# Patient Record
Sex: Female | Born: 1940 | Race: Black or African American | Hispanic: No | Marital: Single | Smoking: Never smoker
Health system: Southern US, Community
[De-identification: ages and names within clinical notes are randomized; demographics above are authoritative.]

## PROBLEM LIST (undated history)

## (undated) DIAGNOSIS — R42 Dizziness and giddiness: Secondary | ICD-10-CM

## (undated) DIAGNOSIS — R509 Fever, unspecified: Secondary | ICD-10-CM

## (undated) HISTORY — PX: EYE SURGERY: SHX253

---

## 2013-04-30 ENCOUNTER — Emergency Department (HOSPITAL_COMMUNITY)
Admission: EM | Admit: 2013-04-30 | Discharge: 2013-04-30 | Disposition: A | Discharge status: Home / Self Care | Payer: Self-pay | Attending: Emergency Medicine | Admitting: Emergency Medicine

## 2013-04-30 ENCOUNTER — Encounter (HOSPITAL_COMMUNITY): Payer: Self-pay | Admitting: Adult Health

## 2013-04-30 ENCOUNTER — Emergency Department (HOSPITAL_COMMUNITY): Payer: Self-pay

## 2013-04-30 DIAGNOSIS — H539 Unspecified visual disturbance: Secondary | ICD-10-CM

## 2013-04-30 DIAGNOSIS — R509 Fever, unspecified: Secondary | ICD-10-CM | POA: Insufficient documentation

## 2013-04-30 DIAGNOSIS — G8929 Other chronic pain: Secondary | ICD-10-CM | POA: Insufficient documentation

## 2013-04-30 DIAGNOSIS — R51 Headache: Secondary | ICD-10-CM | POA: Insufficient documentation

## 2013-04-30 DIAGNOSIS — R42 Dizziness and giddiness: Secondary | ICD-10-CM | POA: Insufficient documentation

## 2013-04-30 DIAGNOSIS — H538 Other visual disturbances: Secondary | ICD-10-CM | POA: Insufficient documentation

## 2013-04-30 DIAGNOSIS — H532 Diplopia: Secondary | ICD-10-CM | POA: Insufficient documentation

## 2013-04-30 HISTORY — DX: Fever, unspecified: R50.9

## 2013-04-30 HISTORY — DX: Dizziness and giddiness: R42

## 2013-04-30 LAB — COMPREHENSIVE METABOLIC PANEL
ALT: 16 U/L (ref 0–35)
AST: 23 U/L (ref 0–37)
Calcium: 9 mg/dL (ref 8.4–10.5)
GFR calc Af Amer: >90 mL/min (ref >90)
Glucose, Bld: 92 mg/dL (ref 70–99)
Sodium: 141 mEq/L (ref 135–145)
Total Protein: 8.4 g/dL — ABNORMAL HIGH (ref 6.0–8.3)

## 2013-04-30 LAB — CBC WITH DIFFERENTIAL/PLATELET
Basophils Absolute: 0 10*3/uL (ref 0.0–0.1)
Basophils Relative: 0 % (ref 0–1)
Eosinophils Absolute: 0.1 10*3/uL (ref 0.0–0.7)
Eosinophils Relative: 2 % (ref 0–5)
MCH: 29.9 pg (ref 26.0–34.0)
MCHC: 35.1 g/dL (ref 30.0–36.0)
MCV: 85.3 fL (ref 78.0–100.0)
Platelets: 247 10*3/uL (ref 150–400)
RDW: 13.2 % (ref 11.5–15.5)

## 2013-04-30 LAB — URINALYSIS, ROUTINE W REFLEX MICROSCOPIC
Bilirubin Urine: NEGATIVE
Hgb urine dipstick: NEGATIVE
Protein, ur: NEGATIVE mg/dL
Urobilinogen, UA: 1 mg/dL (ref 0.0–1.0)

## 2013-04-30 NOTE — ED Provider Notes (Signed)
Medical screening examination/treatment/procedure(s) were performed by non-physician practitioner and as supervising physician I was immediately available for consultation/collaboration.   Charles B. Bernette Mayers, MD 04/30/13 [phone]

## 2013-04-30 NOTE — ED Provider Notes (Signed)
History     CSN: [id number]  Arrival date & time 04/30/13  1614   First MD Initiated Contact with Patient 04/30/13 2010      Chief Complaint  Patient presents with  . multiple complaints     (Consider location/radiation/quality/duration/timing/severity/associated sxs/prior treatment) HPI History provided by pt and her grandson who is interpreting.  [place] from [place] 2 years ago.  She plans to move back soon, and has several problems that she would like to have checked out before she returns.  While in [place], she had intermittent fevers, max temp 106-107, and was admitted multiple times.  She is unsure of etiology.  Has been having same ever since, though less frequent now.  Has not checked her temp at home, but she feels hot all over, and it usually occurs at night.  Also c/o chronic, gradually worsening blurred vision as well as diplopia when looking at distant objects.  These symptoms worsen in sunlight.  Has also had a right temporal headache intermittently for 2 years.  No pain currently.  Headaches are not associated w/ tactile fevers.  Has room-spinning dizziness when she stands up and stretches, but resolves when she sits down.  She has not had change in speech/behavior, extremity weakness/paresthesias, ataxia.  No known PMH.   Past Medical History  Diagnosis Date  . Fever   . Dizziness     History reviewed. No pertinent past surgical history.  History reviewed. No pertinent family history.  History  Substance Use Topics  . Smoking status: Never Smoker   . Smokeless tobacco: Not on file  . Alcohol Use: No    OB History   Grav Para Term Preterm Abortions TAB SAB Ect Mult Living                  Review of Systems  All other systems reviewed and are negative.    Allergies  Review of patient's allergies indicates no known allergies.  Home Medications  No current outpatient prescriptions on file.  BP 172/95  Pulse 88  Temp(Src) 98.4 F (36.9  C) (Oral)  Resp 16  SpO2 97%  Physical Exam  Nursing note and vitals reviewed. Constitutional: She is oriented to person, place, and time. She appears well-developed and well-nourished. No distress.  HENT:  Head: Normocephalic and atraumatic.  Mouth/Throat: Oropharynx is clear and moist.  Eyes:  Corneal clouding and pterygium bilaterally.  Unable to assess visual acuity d/t language barrier.   Neck: Normal range of motion.  Cardiovascular: Normal rate, regular rhythm and intact distal pulses.   Pulmonary/Chest: Effort normal and breath sounds normal. She exhibits no tenderness.  Abdominal: Soft. Bowel sounds are normal. She exhibits no distension. There is no tenderness.  Genitourinary:  No CVA ttp  Musculoskeletal: Normal range of motion.  Neurological: She is alert and oriented to person, place, and time. No sensory deficit. Coordination normal.  CN 3-12 intact.  No nystagmus. 5/5 and equal upper and lower extremity strength.  No past pointing.  Normal gait and asymptomatic w/ ambulation, per nursing staff.   Skin: Skin is warm and dry. No rash noted.  Psychiatric: She has a normal mood and affect. Her behavior is normal.    ED Course  Procedures (including critical care time)  Labs Reviewed  CBC WITH DIFFERENTIAL - Abnormal; Notable for the following:    HCT 34.2 (*)    All other components within normal limits  COMPREHENSIVE METABOLIC PANEL - Abnormal; Notable for the  following:    Total Protein 8.4 (*)    Alkaline Phosphatase 132 (*)    GFR calc non Af Amer 87 (*)    All other components within normal limits  MALARIA SMEAR  URINALYSIS, ROUTINE W REFLEX MICROSCOPIC   Dg Chest 2 View  04/30/2013   *RADIOLOGY REPORT*  Clinical Data: Intermittent fever for the past 2 years.  From [place].  Clinical concern for tuberculosis.  CHEST - 2 VIEW  Comparison: None.  Findings: Normal sized heart.  Clear lungs.  Normal appearing bones.  IMPRESSION: Normal examination.  No evidence of  active or previous tuberculosis infection.   Original Report Authenticated By: Beckie Salts, M.D.   Ct Head Wo Contrast  04/30/2013   *RADIOLOGY REPORT*  Clinical Data: Headache.  Fever and vision change  CT HEAD WITHOUT CONTRAST  Technique:  Contiguous axial images were obtained from the base of the skull through the vertex without contrast.  Comparison: None  Findings: Cerebral volume is normal for age.  Negative for acute infarct.  Negative for hemorrhage or mass.  Ventricle size is normal.  Calvarium is intact.  Visualized sinuses are clear.  IMPRESSION: No acute abnormality.   Original Report Authenticated By: Janeece Riggers, M.D.     1. Chronic headaches   2. Vision changes       MDM  72yo F w/ no known PMH moved here from [place] 2 years ago.  Plans to move back soon, and comes to ED because she doesn't have a physician and there are several things she wants to get checked out before leaving.  Has had intermittent tactile fevers, intermittent headaches (do not occur simultaneously) and chronic, gradually worsening vision changes x 2+ years.  Pt afebrile, well-appearing, no rash, no focal neuro deficits, cataracts and pterygium bilaterally.  CT head, CXR and labs unremarkable, w/ exception of malaria test that is pending.  All results discussed w/ pt and her grandson.  [facility] and [facility].  Return precautions discussed.         Arie Sabina Letizia Hook, PA-C 04/30/13 2128

## 2013-04-30 NOTE — ED Notes (Signed)
Patient unable to complete visual acuity test due to language barrier. Patient speaks an [place] dialect from the country [place] which is [place].

## 2013-04-30 NOTE — ED Notes (Signed)
Presents with 2 years of intermittent fevers and 8 months of headaches and vision trouble. Sunlight makes vision worse and pt has clouding to bilateral eyes. She is african and has been in the states for 2 years. Family reports that she was supposed to have an eye surgery in [place] but did not want to have the surgery there. She reports 2 years of right arm pain from a fall 2 years ago. CMS intact. Pt is afebrile at this time. Family is unable to tell me how high her fevers are due to not having a thermometer here. She does not see a doctor and has not since she has been in the states. Pt is alert and oriented, answers all questions appropraitely.

## 2013-05-04 LAB — MALARIA SMEAR: Special Requests: NORMAL

## 2013-07-01 ENCOUNTER — Ambulatory Visit (HOSPITAL_COMMUNITY): Admission: RE | Admit: 2013-07-01 | Payer: Self-pay | Source: Ambulatory Visit | Admitting: Ophthalmology

## 2013-07-01 ENCOUNTER — Encounter (HOSPITAL_COMMUNITY): Admission: RE | Payer: Self-pay | Source: Ambulatory Visit

## 2013-07-01 SURGERY — PHACOEMULSIFICATION, CATARACT, WITH IOL INSERTION
Anesthesia: Monitor Anesthesia Care | Site: Eye | Laterality: Left

## 2013-12-28 ENCOUNTER — Encounter (HOSPITAL_COMMUNITY): Payer: Self-pay | Admitting: Emergency Medicine

## 2013-12-28 ENCOUNTER — Emergency Department (HOSPITAL_COMMUNITY)
Admission: EM | Admit: 2013-12-28 | Discharge: 2013-12-28 | Disposition: A | Discharge status: Home / Self Care | Payer: Self-pay | Attending: Emergency Medicine | Admitting: Emergency Medicine

## 2013-12-28 DIAGNOSIS — H9313 Tinnitus, bilateral: Secondary | ICD-10-CM

## 2013-12-28 DIAGNOSIS — H9319 Tinnitus, unspecified ear: Secondary | ICD-10-CM | POA: Insufficient documentation

## 2013-12-28 DIAGNOSIS — M1710 Unilateral primary osteoarthritis, unspecified knee: Secondary | ICD-10-CM | POA: Insufficient documentation

## 2013-12-28 DIAGNOSIS — M1990 Unspecified osteoarthritis, unspecified site: Secondary | ICD-10-CM

## 2013-12-28 DIAGNOSIS — IMO0002 Reserved for concepts with insufficient information to code with codable children: Secondary | ICD-10-CM

## 2013-12-28 DIAGNOSIS — K219 Gastro-esophageal reflux disease without esophagitis: Secondary | ICD-10-CM | POA: Insufficient documentation

## 2013-12-28 LAB — CBC WITH DIFFERENTIAL/PLATELET
Basophils Absolute: 0 10*3/uL (ref 0.0–0.1)
Basophils Relative: 0 % (ref 0–1)
EOS ABS: 0.1 10*3/uL (ref 0.0–0.7)
EOS PCT: 2 % (ref 0–5)
HEMATOCRIT: 32.7 % — AB (ref 36.0–46.0)
Hemoglobin: 10.9 g/dL — ABNORMAL LOW (ref 12.0–15.0)
LYMPHS ABS: 2.1 10*3/uL (ref 0.7–4.0)
LYMPHS PCT: 32 % (ref 12–46)
MCH: 27 pg (ref 26.0–34.0)
MCHC: 33.3 g/dL (ref 30.0–36.0)
MCV: 81.1 fL (ref 78.0–100.0)
MONO ABS: 1 10*3/uL (ref 0.1–1.0)
Monocytes Relative: 16 % — ABNORMAL HIGH (ref 3–12)
Neutro Abs: 3.2 10*3/uL (ref 1.7–7.7)
Neutrophils Relative %: 50 % (ref 43–77)
PLATELETS: 257 10*3/uL (ref 150–400)
RBC: 4.03 MIL/uL (ref 3.87–5.11)
RDW: 13.4 % (ref 11.5–15.5)
WBC: 6.4 10*3/uL (ref 4.0–10.5)

## 2013-12-28 LAB — COMPREHENSIVE METABOLIC PANEL
ALT: 15 U/L (ref 0–35)
AST: 20 U/L (ref 0–37)
Albumin: 3.4 g/dL — ABNORMAL LOW (ref 3.5–5.2)
Alkaline Phosphatase: 111 U/L (ref 39–117)
BUN: 11 mg/dL (ref 6–23)
CALCIUM: 8.9 mg/dL (ref 8.4–10.5)
CO2: 27 meq/L (ref 19–32)
CREATININE: 0.43 mg/dL — AB (ref 0.50–1.10)
Chloride: 103 mEq/L (ref 96–112)
GLUCOSE: 91 mg/dL (ref 70–99)
Potassium: 3.7 mEq/L (ref 3.7–5.3)
Sodium: 141 mEq/L (ref 137–147)
TOTAL PROTEIN: 8.2 g/dL (ref 6.0–8.3)
Total Bilirubin: 0.3 mg/dL (ref 0.3–1.2)

## 2013-12-28 LAB — URINALYSIS, ROUTINE W REFLEX MICROSCOPIC
Bilirubin Urine: NEGATIVE
Glucose, UA: NEGATIVE mg/dL
HGB URINE DIPSTICK: NEGATIVE
Ketones, ur: NEGATIVE mg/dL
LEUKOCYTES UA: NEGATIVE
NITRITE: NEGATIVE
PROTEIN: NEGATIVE mg/dL
SPECIFIC GRAVITY, URINE: 1.015 (ref 1.005–1.030)
UROBILINOGEN UA: 0.2 mg/dL (ref 0.0–1.0)
pH: 7.5 (ref 5.0–8.0)

## 2013-12-28 LAB — LIPASE, BLOOD: LIPASE: 29 U/L (ref 11–59)

## 2013-12-28 MED ORDER — RANITIDINE HCL 150 MG PO TABS: 150.0000 mg | ORAL_TABLET | Freq: Two times a day (BID) | ORAL | Status: AC

## 2013-12-28 MED ORDER — ACETAMINOPHEN 500 MG PO TABS: 500.0000 mg | ORAL_TABLET | Freq: Four times a day (QID) | ORAL | Status: AC | PRN

## 2013-12-28 NOTE — ED Provider Notes (Addendum)
CSN: [id number]     Arrival date & time 12/28/13  1212 History   First MD Initiated Contact with Patient 12/28/13 1800     Chief Complaint  Patient presents with  . Knee Pain  . Abdominal Pain     (Consider location/radiation/quality/duration/timing/severity/associated sxs/prior Treatment) HPI Comments: Also patient is complaining of tinnitus and has been in the left ear for years but started in the right ear yesterday.  Patient is a 73 y.o. female presenting with knee pain and abdominal pain. The history is provided by the patient and a relative.  Knee Pain Location:  Knee Injury: no   Knee location:  L knee and R knee Pain details:    Quality:  Aching   Radiates to:  Does not radiate   Severity:  Moderate   Onset quality:  Gradual   Timing:  Intermittent   Progression:  Unchanged Chronicity:  Recurrent Prior injury to area:  No Relieved by:  None tried Worsened by:  Activity Ineffective treatments:  None tried Associated symptoms: no muscle weakness, no stiffness, no swelling and no tingling   Abdominal Pain Pain location:  Epigastric Pain quality: burning   Pain radiates to:  Does not radiate Pain severity:  Moderate Onset quality:  Gradual Duration: 6 months. Timing:  Intermittent Progression:  Unchanged Context: eating   Relieved by:  None tried Worsened by:  Eating (only starts after eating) Ineffective treatments:  None tried Associated symptoms: no anorexia, no chest pain, no chills, no constipation, no nausea, no shortness of breath and no vomiting   Risk factors: being elderly   Risk factors: no aspirin use and no NSAID use     Past Medical History  Diagnosis Date  . Fever   . Dizziness    Past Surgical History  Procedure Laterality Date  . Eye surgery     No family history on file. History  Substance Use Topics  . Smoking status: Never Smoker   . Smokeless tobacco: Not on file  . Alcohol Use: No   OB History   Grav Para Term Preterm  Abortions TAB SAB Ect Mult Living                 Review of Systems  Constitutional: Negative for chills.  Respiratory: Negative for shortness of breath.   Cardiovascular: Negative for chest pain.  Gastrointestinal: Positive for abdominal pain. Negative for nausea, vomiting, constipation and anorexia.  Musculoskeletal: Negative for stiffness.  All other systems reviewed and are negative.      Allergies  Review of patient's allergies indicates no known allergies.  Home Medications  No current outpatient prescriptions on file. BP 139/78  Pulse 105  Temp(Src) 98.3 F (36.8 C) (Oral)  Resp 20  Wt 162 lb 1.6 oz (73.528 kg)  SpO2 98% Physical Exam  Nursing note and vitals reviewed. Constitutional: She is oriented to person, place, and time. She appears well-developed and well-nourished. No distress.  HENT:  Head: Normocephalic and atraumatic.  Right Ear: Tympanic membrane and ear canal normal.  Left Ear: Tympanic membrane and ear canal normal.  Mouth/Throat: Oropharynx is clear and moist.  Eyes: Conjunctivae and EOM are normal. Pupils are equal, round, and reactive to light.  Neck: Normal range of motion. Neck supple.  Cardiovascular: Normal rate, regular rhythm and intact distal pulses.   No murmur heard. Pulmonary/Chest: Effort normal and breath sounds normal. No respiratory distress. She has no wheezes. She has no rales.  Abdominal: Soft. She exhibits no distension. There  is no tenderness. There is no rebound and no guarding.  Musculoskeletal: Normal range of motion. She exhibits no edema and no tenderness.  Mild pain with ROM of the knees without swelling or warmth  Neurological: She is alert and oriented to person, place, and time.  Skin: Skin is warm and dry. No rash noted. No erythema.  Psychiatric: She has a normal mood and affect. Her behavior is normal.    ED Course  Procedures (including critical care time) Labs Review Labs Reviewed  CBC WITH DIFFERENTIAL -  Abnormal; Notable for the following:    Hemoglobin 10.9 (*)    HCT 32.7 (*)    Monocytes Relative 16 (*)    All other components within normal limits  COMPREHENSIVE METABOLIC PANEL - Abnormal; Notable for the following:    Creatinine, Ser 0.43 (*)    Albumin 3.4 (*)    All other components within normal limits  LIPASE, BLOOD  URINALYSIS, ROUTINE W REFLEX MICROSCOPIC   Imaging Review No results found.  EKG Interpretation    Date/Time:  Monday December 28 2013 12:42:46 EST Ventricular Rate:  97 PR Interval:  142 QRS Duration: 72 QT Interval:  358 QTC Calculation: 454 R Axis:   36 Text Interpretation:  Normal sinus rhythm Normal ECG No previous tracing Confirmed by Anitra Lauth  MD, Thales Knipple ([id number]) on 12/28/2013 5:20:31 PM            MDM   Final diagnoses:  GERD (gastroesophageal reflux disease)  Tinnitus of both ears  Arthritis     Patient is here with multiple issues as she does not have a primary care provider. Her initial complaint is ringing in her years. She's had ringing in the left ear for years and then yesterday developed ringing in the right ear. No balance problems or focal weakness concerning for stroke. No sign of fluid behind the TM's and no sign of infection. Patient is not a chronic over-the-counter medications that would cause tinnitus.  Secondly patient is complaining of GERD-like symptoms without any symptoms concerning for cardiac issues. This is been ongoing for 6 months and only painful after eating. Will start patient on Zantac and she's tried no over-the-counter medications. Currently patient is complaining of bilateral knee pain worse with weather changes and when she goes from sitting to standing. She denies any swelling in her knees and also has not taken any medications for this patient try Tylenol.   Gwyneth Sprout, MD 12/28/13 1840  Gwyneth Sprout, MD 12/28/13 1842

## 2013-12-28 NOTE — ED Notes (Signed)
Bilateral knee  X 1 month  And indigestion no n/v /d for 6 months  Pain  And she has ringing in ears started yesterday

## 2014-08-16 IMAGING — CT CT HEAD W/O CM
1 series · 16 of 30 positions shown, 20 images · non-contrast
Comparison: None

CLINICAL DATA: Headache.  Fever and vision change

CT HEAD WITHOUT CONTRAST
TECHNIQUE: Contiguous axial images were obtained from the base of
the skull through the vertex without contrast.

[Series 2: head trauma 4.8 h37s · axial · 0.43mm/px · z∈[-143,+15]mm · 16 of 36 slices shown, 20 images]
[im 2/36  brain]
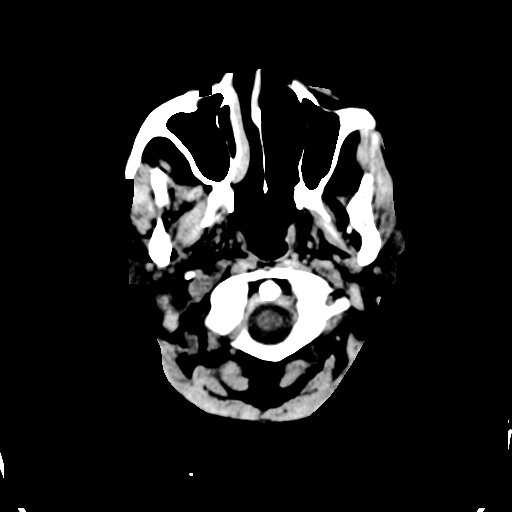
[im 2/36  bone]
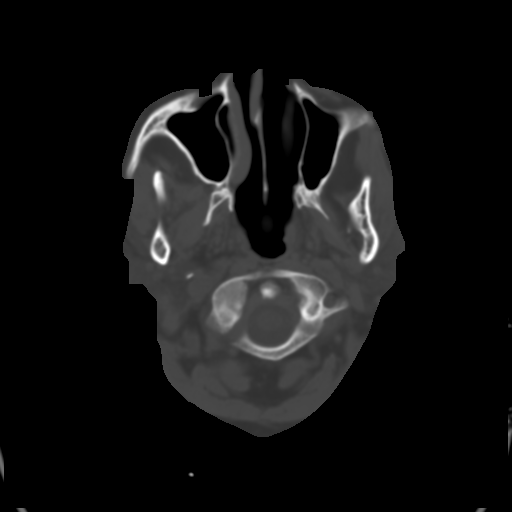
[im 4/36  brain]
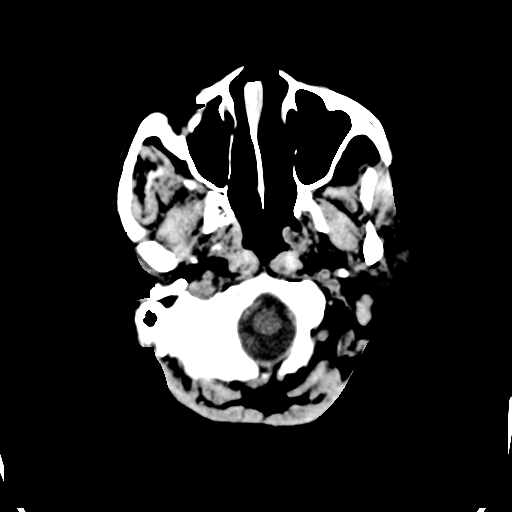
[im 7/36  brain]
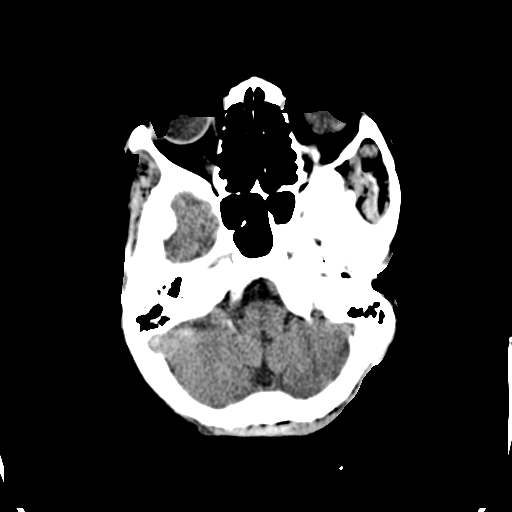
[im 9/36  brain]
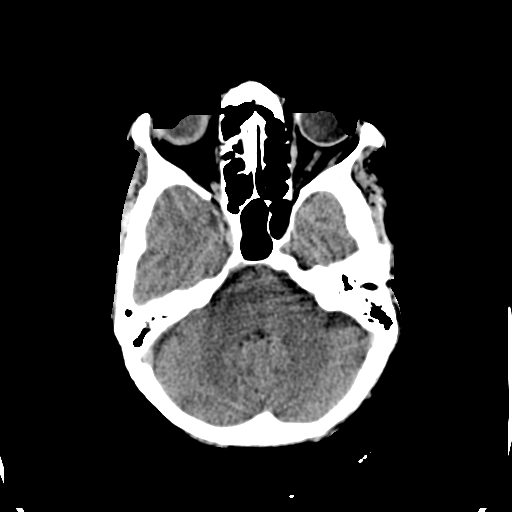
[im 10/36  brain]
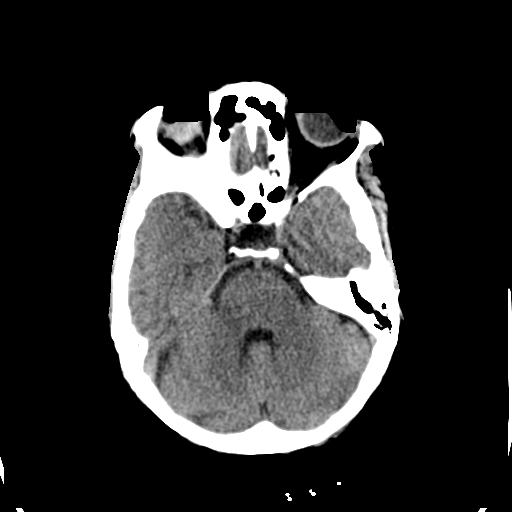
[im 10/36  bone]
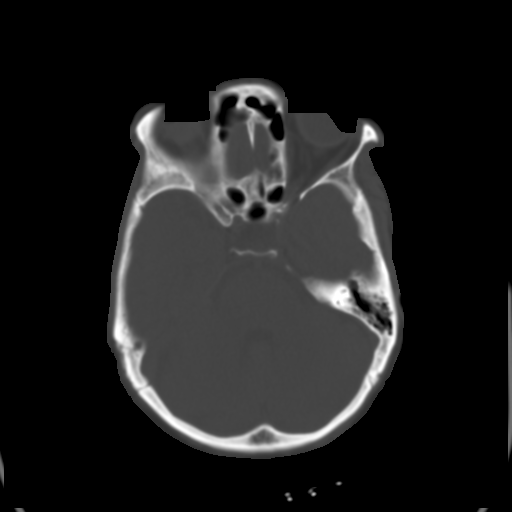
[im 13/36  brain]
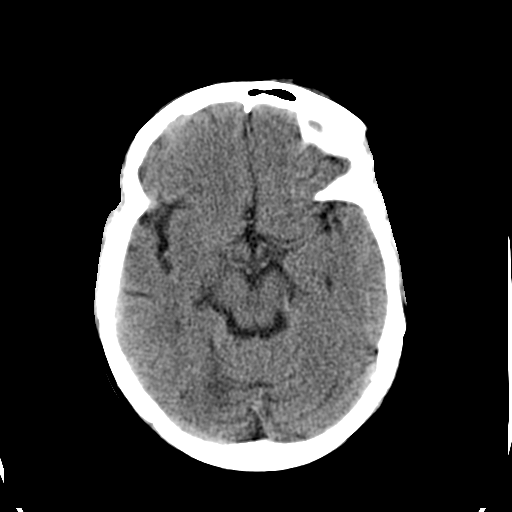
[im 15/36  brain]
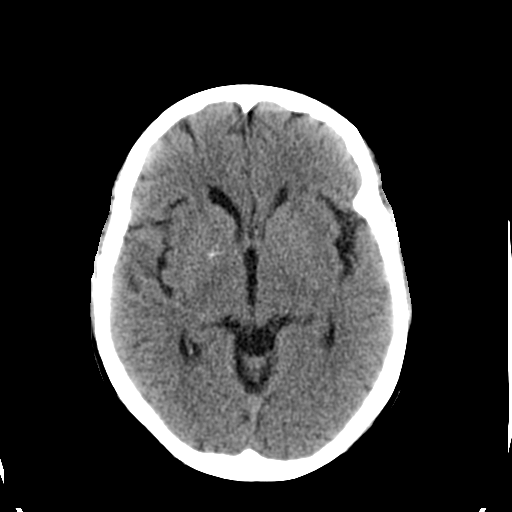
[im 17/36  brain]
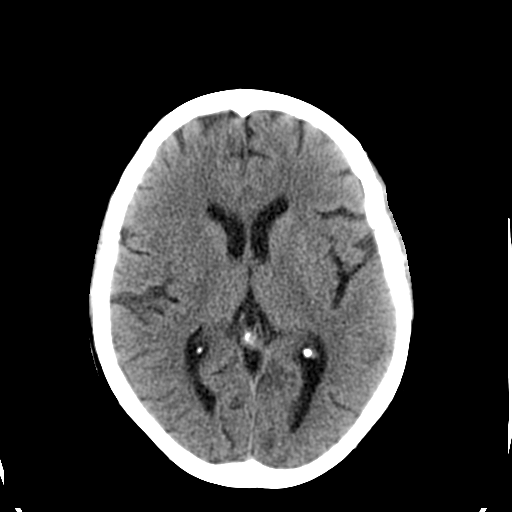
[im 19/36  brain]
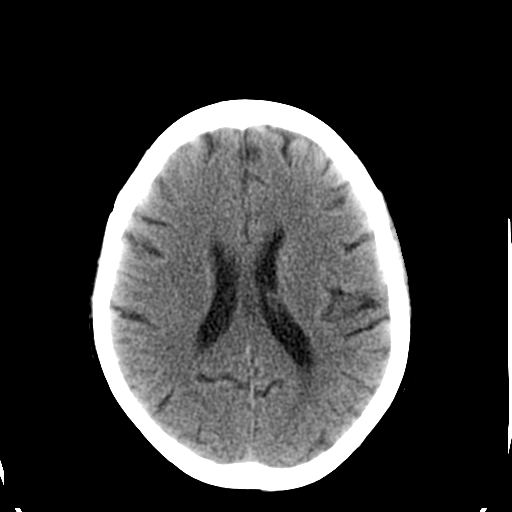
[im 19/36  bone]
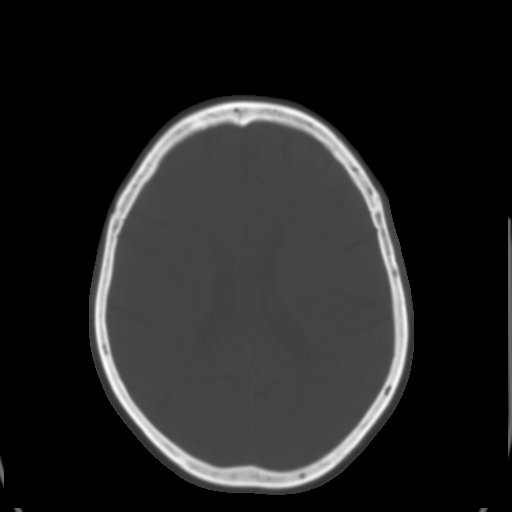
[im 21/36  brain]
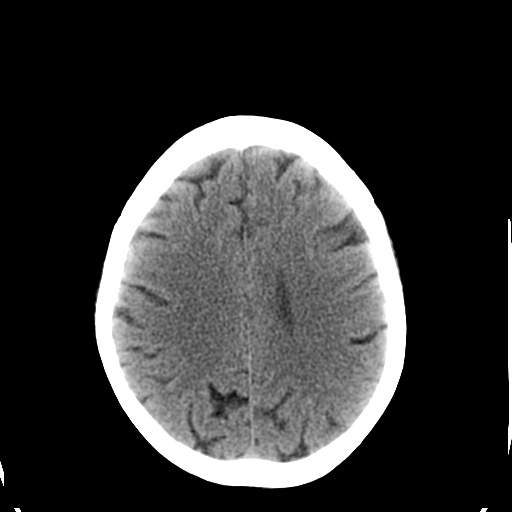
[im 23/36  brain]
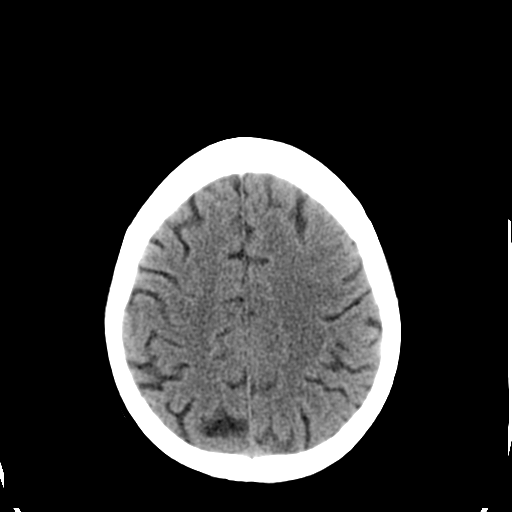
[im 26/36  brain]
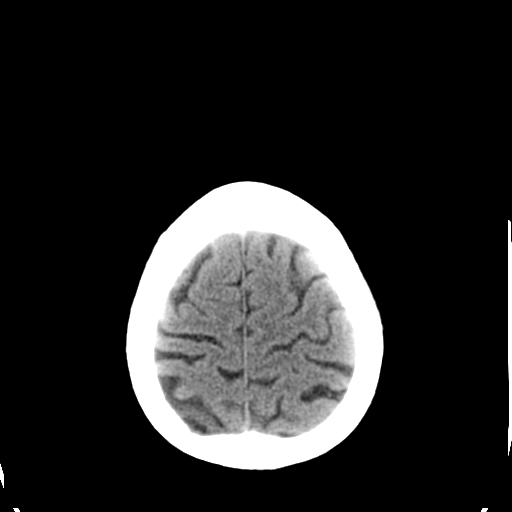
[im 27/36  brain]
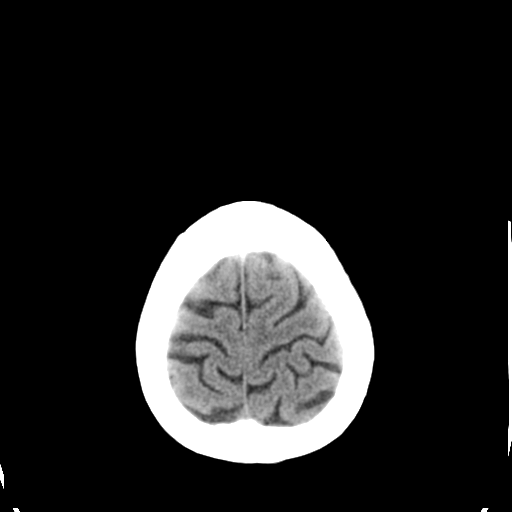
[im 27/36  bone]
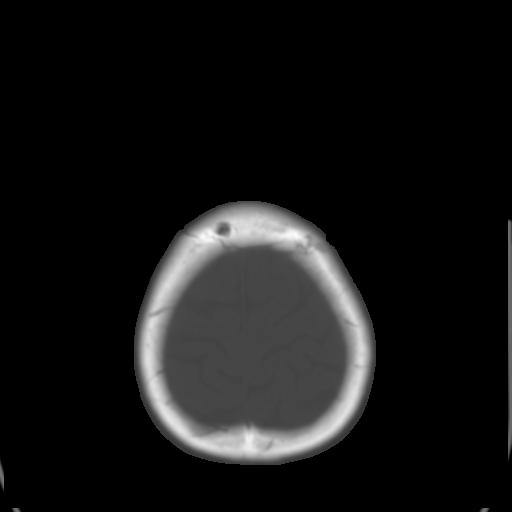
[im 29/36  brain]
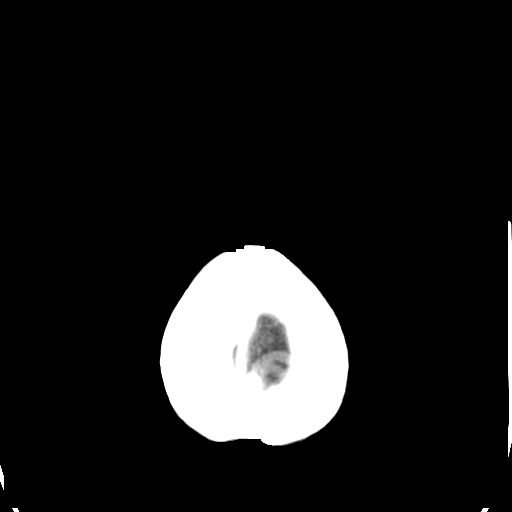
[im 32/36  brain]
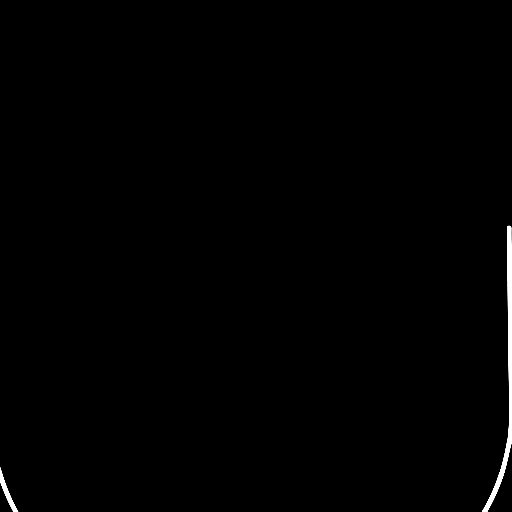
[im 34/36  brain]
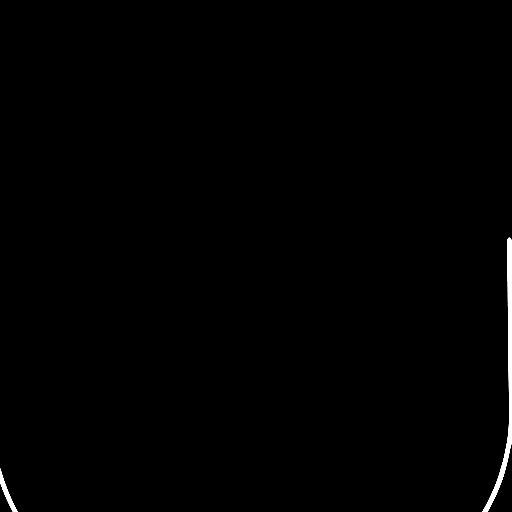

[16 of 30 positions shown; findings below may reference images not displayed]

FINDINGS: Cerebral volume is normal for age.  Negative for acute
infarct.  Negative for hemorrhage or mass.  Ventricle size is
normal.  Calvarium is intact.  Visualized sinuses are clear.
IMPRESSION: No acute abnormality.

## 2014-08-16 IMAGING — CR DG CHEST 2V
2 series · 2 of 2 positions shown · non-contrast
Comparison: None.

CLINICAL DATA: Intermittent fever for the past 2 years.  From
[place].  Clinical concern for tuberculosis.

CHEST - 2 VIEW

[w chest pa]
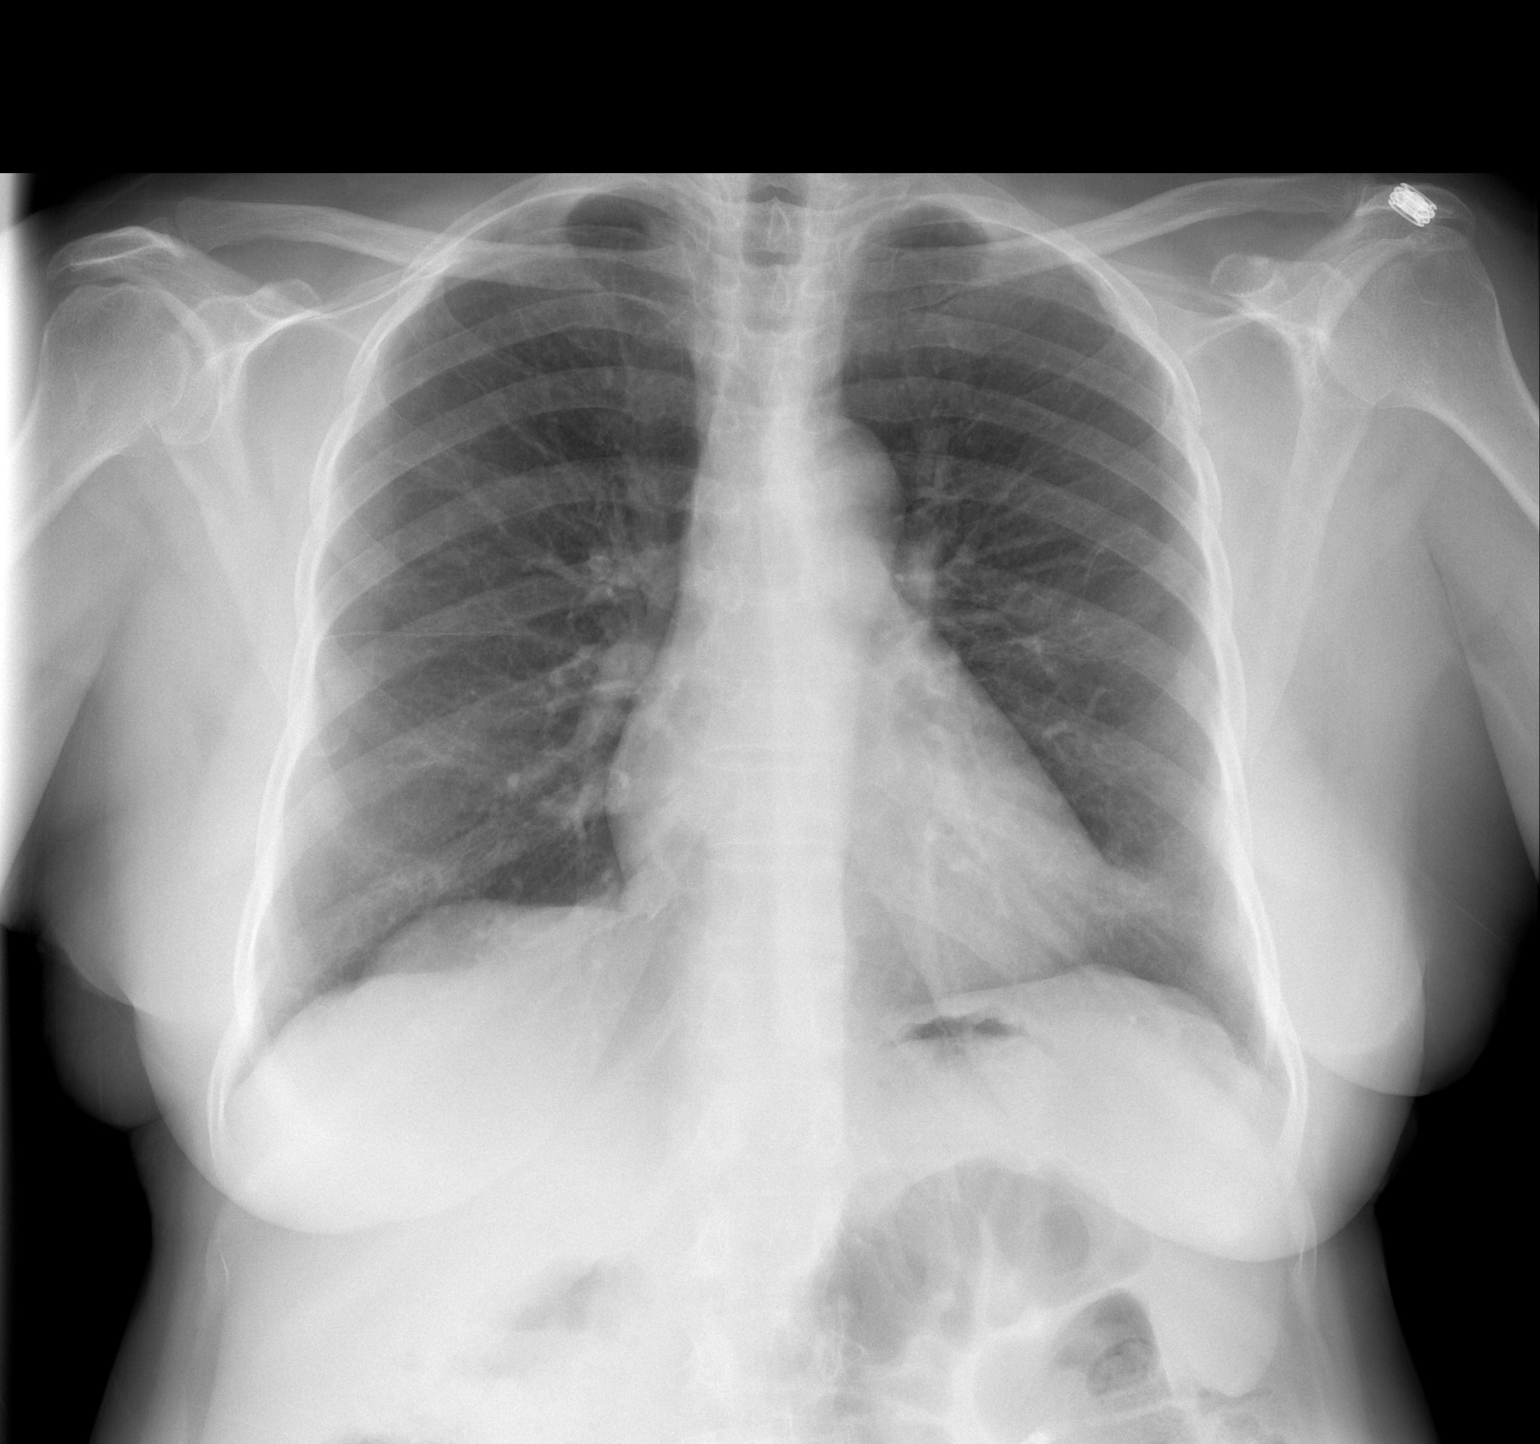

[w chest lat]
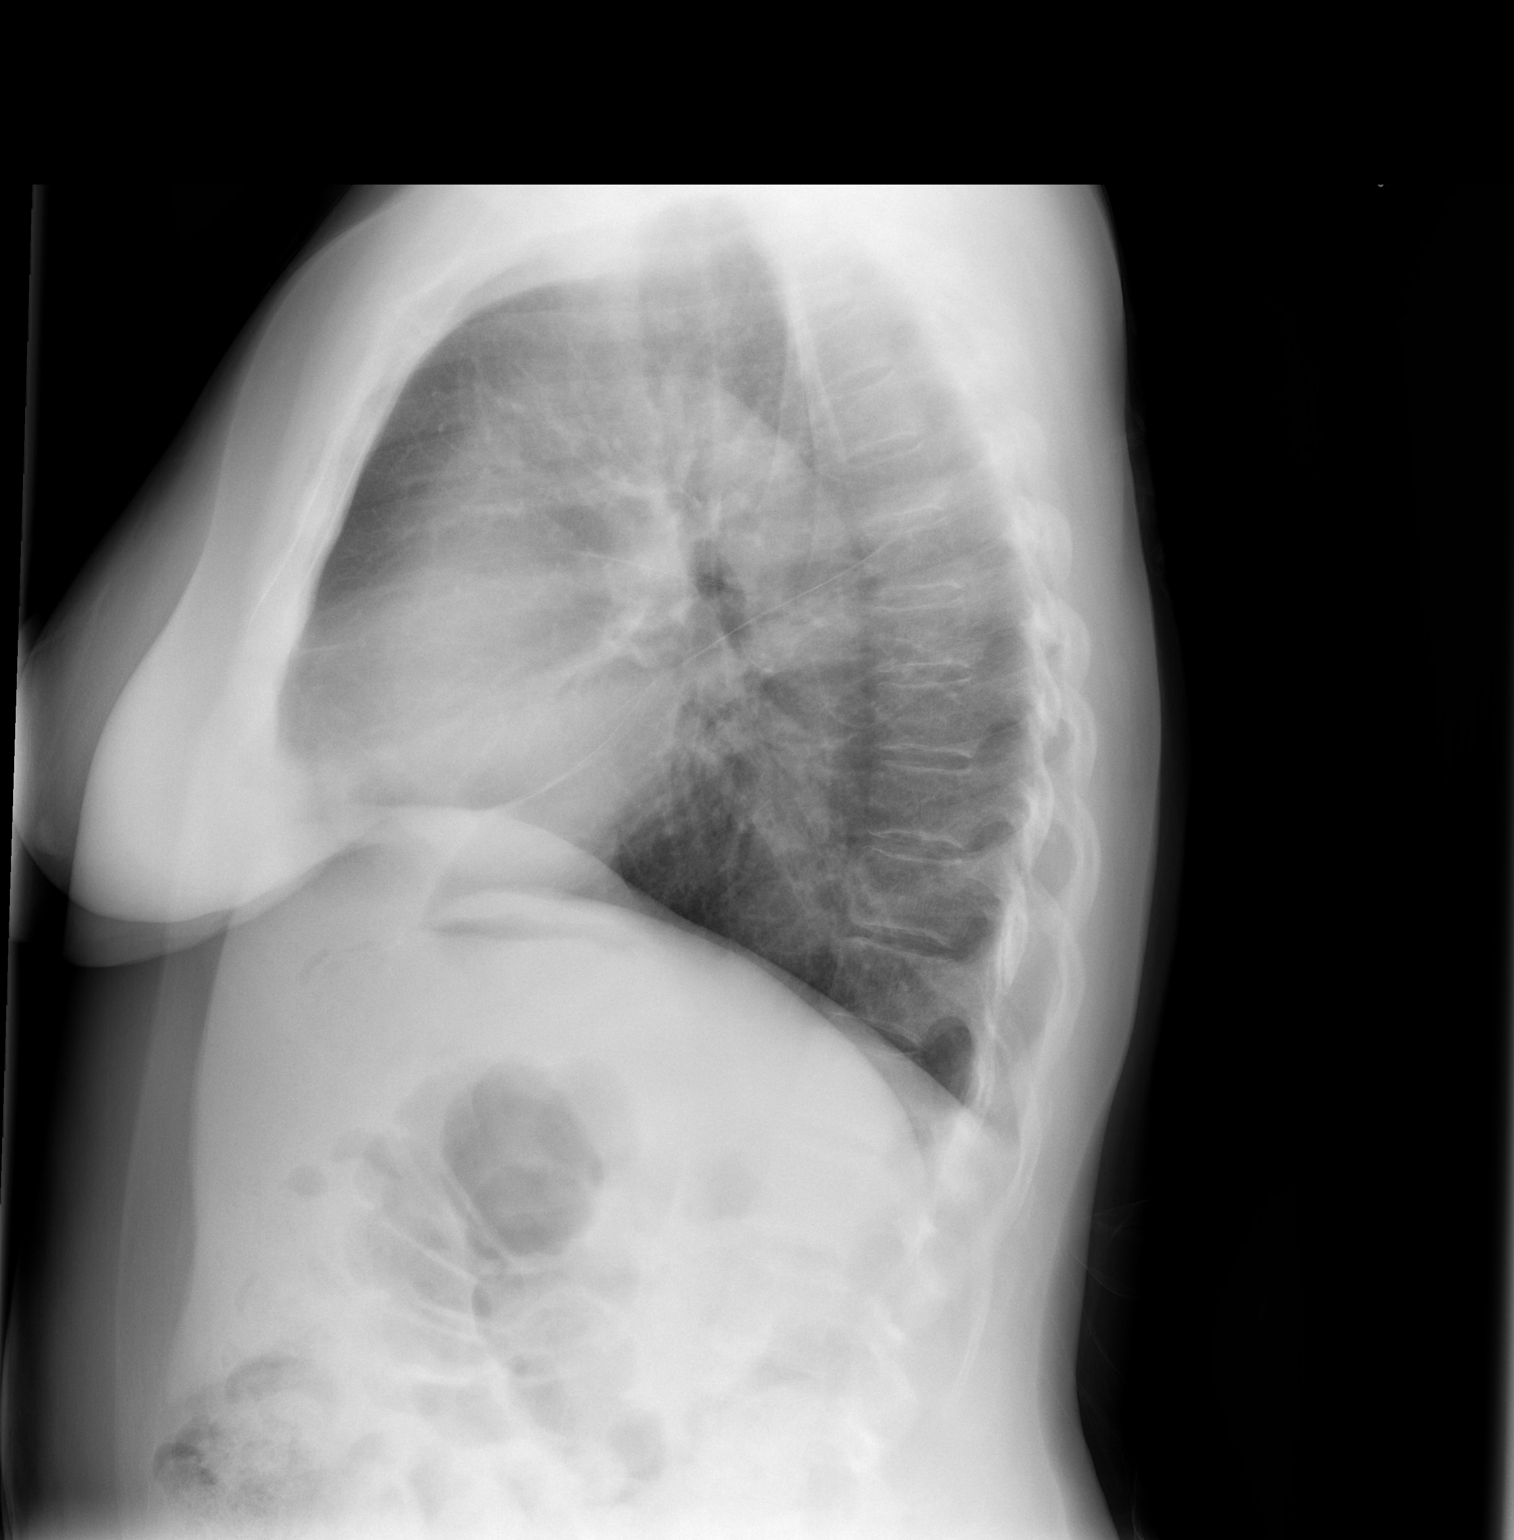

[2 of 2 positions shown; findings below may reference images not displayed]

FINDINGS: Normal sized heart.  Clear lungs.  Normal appearing
bones.
IMPRESSION: Normal examination.  No evidence of active or previous tuberculosis
infection.
